# Patient Record
Sex: Male | Born: 1945 | Race: Black or African American | Hispanic: No | Marital: Married
Health system: Southern US, Community
[De-identification: ages and names within clinical notes are randomized; demographics above are authoritative.]

---

## 2012-02-07 ENCOUNTER — Ambulatory Visit: Payer: Self-pay | Admitting: Internal Medicine

## 2012-02-07 ENCOUNTER — Inpatient Hospital Stay: Payer: Self-pay | Admitting: Internal Medicine

## 2012-02-07 LAB — COMPREHENSIVE METABOLIC PANEL
Albumin: 3 g/dL — ABNORMAL LOW (ref 3.4–5.0)
Alkaline Phosphatase: 54 U/L (ref 50–136)
Anion Gap: 9 (ref 7–16)
BUN: 16 mg/dL (ref 7–18)
Bilirubin,Total: 1.7 mg/dL — ABNORMAL HIGH (ref 0.2–1.0)
Calcium, Total: 8.5 mg/dL (ref 8.5–10.1)
Chloride: 91 mmol/L — ABNORMAL LOW (ref 98–107)
Co2: 34 mmol/L — ABNORMAL HIGH (ref 21–32)
Creatinine: 1.28 mg/dL (ref 0.60–1.30)
EGFR (African American): >60
EGFR (Non-African Amer.): 60 — ABNORMAL LOW
Glucose: 77 mg/dL (ref 65–99)
Osmolality: 268 (ref 275–301)
Potassium: 3 mmol/L — ABNORMAL LOW (ref 3.5–5.1)
SGOT(AST): 181 U/L — ABNORMAL HIGH (ref 15–37)
SGPT (ALT): 105 U/L — ABNORMAL HIGH
Sodium: 134 mmol/L — ABNORMAL LOW (ref 136–145)
Total Protein: 6.4 g/dL (ref 6.4–8.2)

## 2012-02-07 LAB — CBC WITH DIFFERENTIAL/PLATELET
Basophil #: 0 10*3/uL (ref 0.0–0.1)
Basophil %: 0.2 %
Eosinophil #: 0 10*3/uL (ref 0.0–0.7)
Eosinophil %: 0.4 %
HCT: 44.4 % (ref 40.0–52.0)
HGB: 14.2 g/dL (ref 13.0–18.0)
Lymphocyte #: 1.5 10*3/uL (ref 1.0–3.6)
Lymphocyte %: 12.7 %
MCH: 28.4 pg (ref 26.0–34.0)
MCHC: 31.9 g/dL — ABNORMAL LOW (ref 32.0–36.0)
MCV: 89 fL (ref 80–100)
Monocyte #: 0.5 10*3/uL (ref 0.0–0.7)
Monocyte %: 4.2 %
Neutrophil #: 9.5 10*3/uL — ABNORMAL HIGH (ref 1.4–6.5)
Neutrophil %: 82.5 %
Platelet: 179 10*3/uL (ref 150–440)
RBC: 4.98 10*6/uL (ref 4.40–5.90)
RDW: 14.8 % — ABNORMAL HIGH (ref 11.5–14.5)
WBC: 11.5 10*3/uL — ABNORMAL HIGH (ref 3.8–10.6)

## 2012-02-07 LAB — PRO B NATRIURETIC PEPTIDE: B-Type Natriuretic Peptide: 6402 pg/mL — ABNORMAL HIGH (ref 0–125)

## 2012-02-07 LAB — CK TOTAL AND CKMB (NOT AT ARMC)
CK, Total: 2152 U/L — ABNORMAL HIGH (ref 35–232)
CK, Total: 1817 U/L — ABNORMAL HIGH (ref 35–232)
CK-MB: 6.5 ng/mL — ABNORMAL HIGH (ref 0.5–3.6)
CK-MB: 6.7 ng/mL — ABNORMAL HIGH (ref 0.5–3.6)

## 2012-02-07 LAB — TROPONIN I
Troponin-I: 0.65 ng/mL — ABNORMAL HIGH
Troponin-I: 0.55 ng/mL — ABNORMAL HIGH

## 2012-02-07 LAB — TSH: Thyroid Stimulating Horm: 1.91 u[IU]/mL

## 2012-02-08 LAB — LIPID PANEL
Cholesterol: 118 mg/dL (ref 0–200)
HDL Cholesterol: 14 mg/dL — ABNORMAL LOW (ref 40–60)
Ldl Cholesterol, Calc: 83 mg/dL (ref 0–100)
Triglycerides: 105 mg/dL (ref 0–200)
VLDL Cholesterol, Calc: 21 mg/dL (ref 5–40)

## 2012-02-08 LAB — TROPONIN I: Troponin-I: 0.6 ng/mL — ABNORMAL HIGH

## 2012-02-08 LAB — CK TOTAL AND CKMB (NOT AT ARMC)
CK, Total: 1213 U/L — ABNORMAL HIGH (ref 35–232)
CK-MB: 5.5 ng/mL — ABNORMAL HIGH (ref 0.5–3.6)

## 2012-02-10 LAB — BASIC METABOLIC PANEL
Anion Gap: 10 (ref 7–16)
BUN: 22 mg/dL — ABNORMAL HIGH (ref 7–18)
Calcium, Total: 8.2 mg/dL — ABNORMAL LOW (ref 8.5–10.1)
Chloride: 96 mmol/L — ABNORMAL LOW (ref 98–107)
Co2: 36 mmol/L — ABNORMAL HIGH (ref 21–32)
Creatinine: 1.44 mg/dL — ABNORMAL HIGH (ref 0.60–1.30)
EGFR (African American): >60
EGFR (Non-African Amer.): 52 — ABNORMAL LOW
Glucose: 77 mg/dL (ref 65–99)
Osmolality: 285 (ref 275–301)
Potassium: 3.4 mmol/L — ABNORMAL LOW (ref 3.5–5.1)
Sodium: 142 mmol/L (ref 136–145)

## 2012-02-10 LAB — CBC WITH DIFFERENTIAL/PLATELET
Basophil #: 0.1 10*3/uL (ref 0.0–0.1)
Basophil %: 0.7 %
Eosinophil #: 0.1 10*3/uL (ref 0.0–0.7)
Eosinophil %: 1.2 %
HCT: 39.7 % — ABNORMAL LOW (ref 40.0–52.0)
HGB: 12.6 g/dL — ABNORMAL LOW (ref 13.0–18.0)
Lymphocyte #: 2.3 10*3/uL (ref 1.0–3.6)
Lymphocyte %: 25.2 %
MCH: 28.5 pg (ref 26.0–34.0)
MCHC: 31.9 g/dL — ABNORMAL LOW (ref 32.0–36.0)
MCV: 89 fL (ref 80–100)
Monocyte #: 0.6 10*3/uL (ref 0.0–0.7)
Monocyte %: 6.1 %
Neutrophil #: 6.1 10*3/uL (ref 1.4–6.5)
Neutrophil %: 66.8 %
Platelet: 209 10*3/uL (ref 150–440)
RBC: 4.44 10*6/uL (ref 4.40–5.90)
RDW: 15.3 % — ABNORMAL HIGH (ref 11.5–14.5)
WBC: 9.2 10*3/uL (ref 3.8–10.6)

## 2012-02-28 ENCOUNTER — Ambulatory Visit: Payer: Self-pay | Admitting: Internal Medicine

## 2012-02-29 ENCOUNTER — Other Ambulatory Visit: Payer: Self-pay | Admitting: Internal Medicine

## 2012-02-29 LAB — BASIC METABOLIC PANEL
Anion Gap: 10 (ref 7–16)
BUN: 16 mg/dL (ref 7–18)
Calcium, Total: 8.4 mg/dL — ABNORMAL LOW (ref 8.5–10.1)
EGFR (Non-African Amer.): 52 — ABNORMAL LOW
Glucose: 102 mg/dL — ABNORMAL HIGH (ref 65–99)
Osmolality: 288 (ref 275–301)

## 2012-02-29 LAB — CBC WITH DIFFERENTIAL/PLATELET
Basophil #: 0.2 10*3/uL — ABNORMAL HIGH (ref 0.0–0.1)
Basophil %: 1.9 %
Eosinophil %: 1.8 %
HCT: 41.8 % (ref 40.0–52.0)
HGB: 13.4 g/dL (ref 13.0–18.0)
Lymphocyte %: 14.6 %
MCHC: 32.1 g/dL (ref 32.0–36.0)
Monocyte #: 0.8 10*3/uL — ABNORMAL HIGH (ref 0.0–0.7)
Monocyte %: 7.7 %
Neutrophil #: 7.4 10*3/uL — ABNORMAL HIGH (ref 1.4–6.5)
Neutrophil %: 74 %
RBC: 4.79 10*6/uL (ref 4.40–5.90)
WBC: 10.1 10*3/uL (ref 3.8–10.6)

## 2012-03-01 ENCOUNTER — Ambulatory Visit: Payer: Self-pay | Admitting: Internal Medicine

## 2012-03-07 ENCOUNTER — Ambulatory Visit: Payer: Self-pay | Admitting: Internal Medicine

## 2012-03-19 ENCOUNTER — Ambulatory Visit: Payer: Self-pay | Admitting: Gastroenterology

## 2012-07-08 ENCOUNTER — Ambulatory Visit: Payer: Self-pay | Admitting: Internal Medicine

## 2012-09-17 DEATH — deceased

## 2014-03-24 IMAGING — CR DG SHOULDER 3+V*L*
1 series · 3 of 3 positions shown · non-contrast
Comparison: none

REASON FOR EXAM: Left Shoulder Pain
COMMENTS:

PROCEDURE:     KDR - KDXR SHOULDER LEFT COMPLETE  - July 08, 2012 [DATE]
RESULT:     Osteoarthritic changes are evident demonstrated by areas of mild
osteophytosis. There is no evidence of acute fracture or dislocation. The
left lung apex is unremarkable.

[Series 1: internal rotate · 0.17mm/px · 3 of 3 slices shown]
[im 1/3]
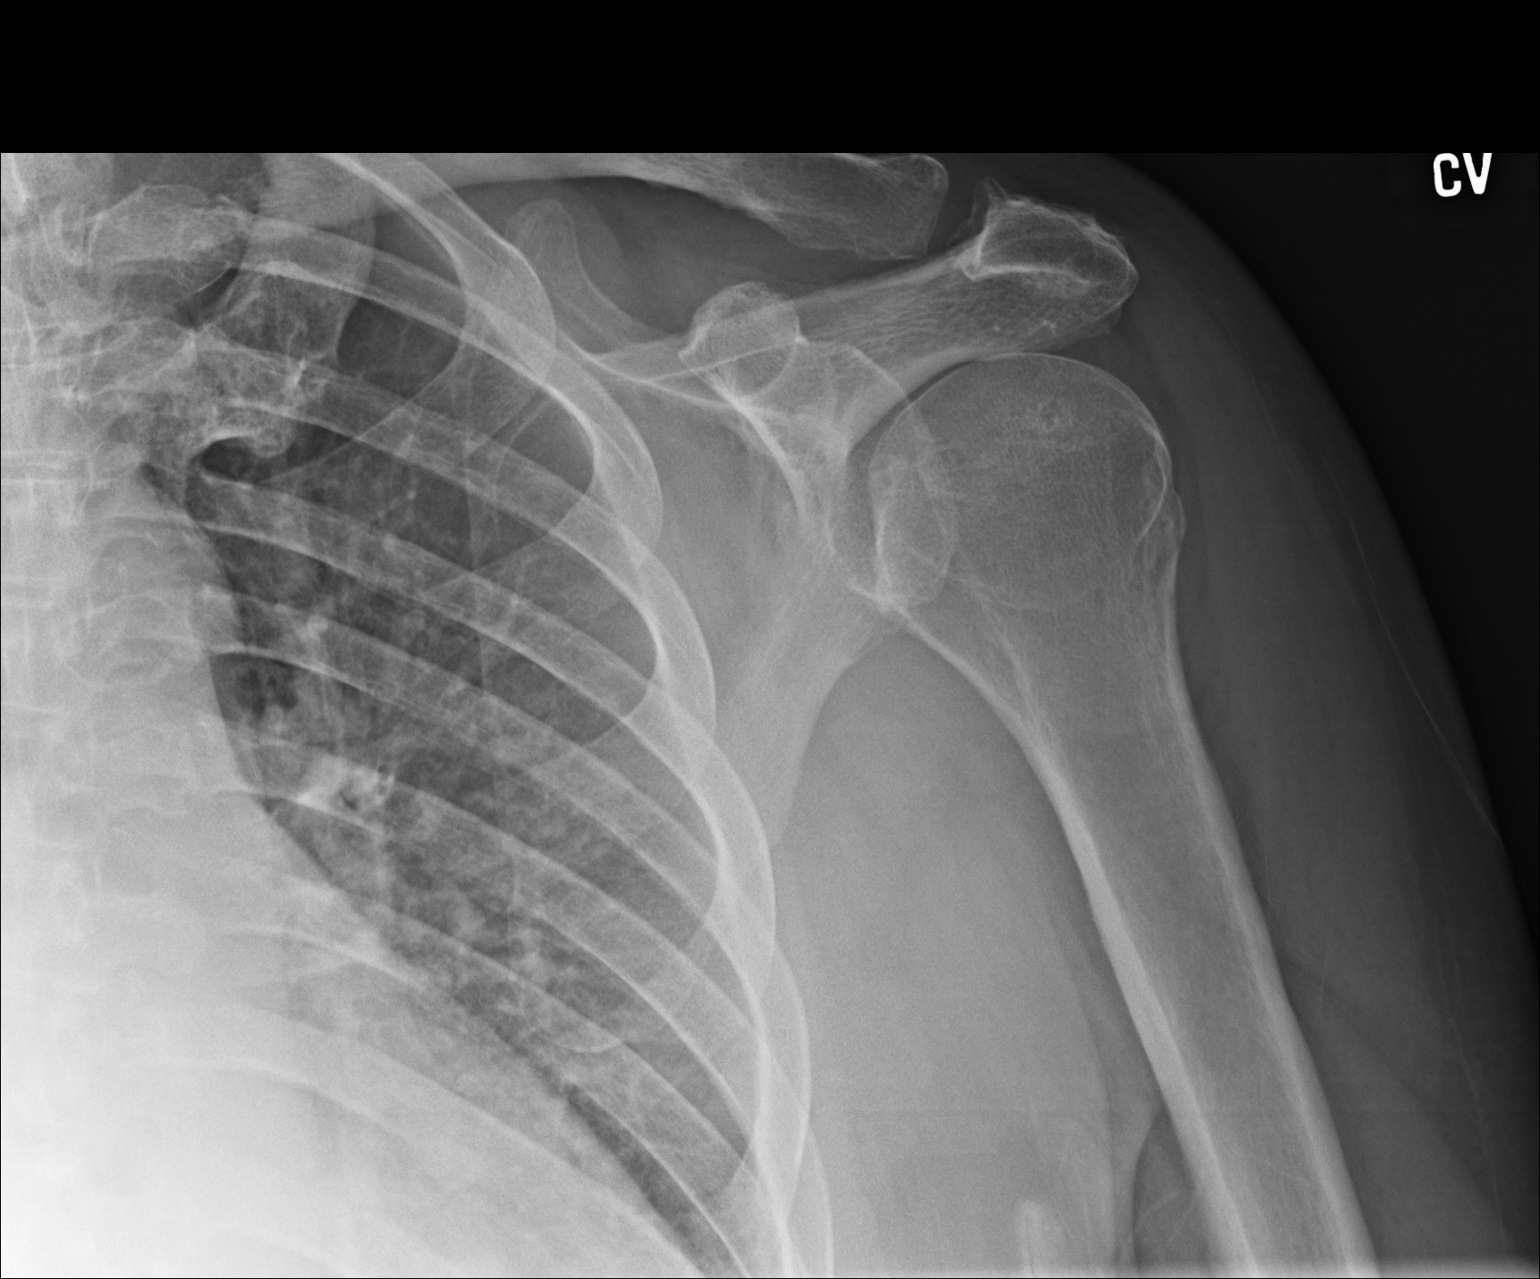
[im 2/3]
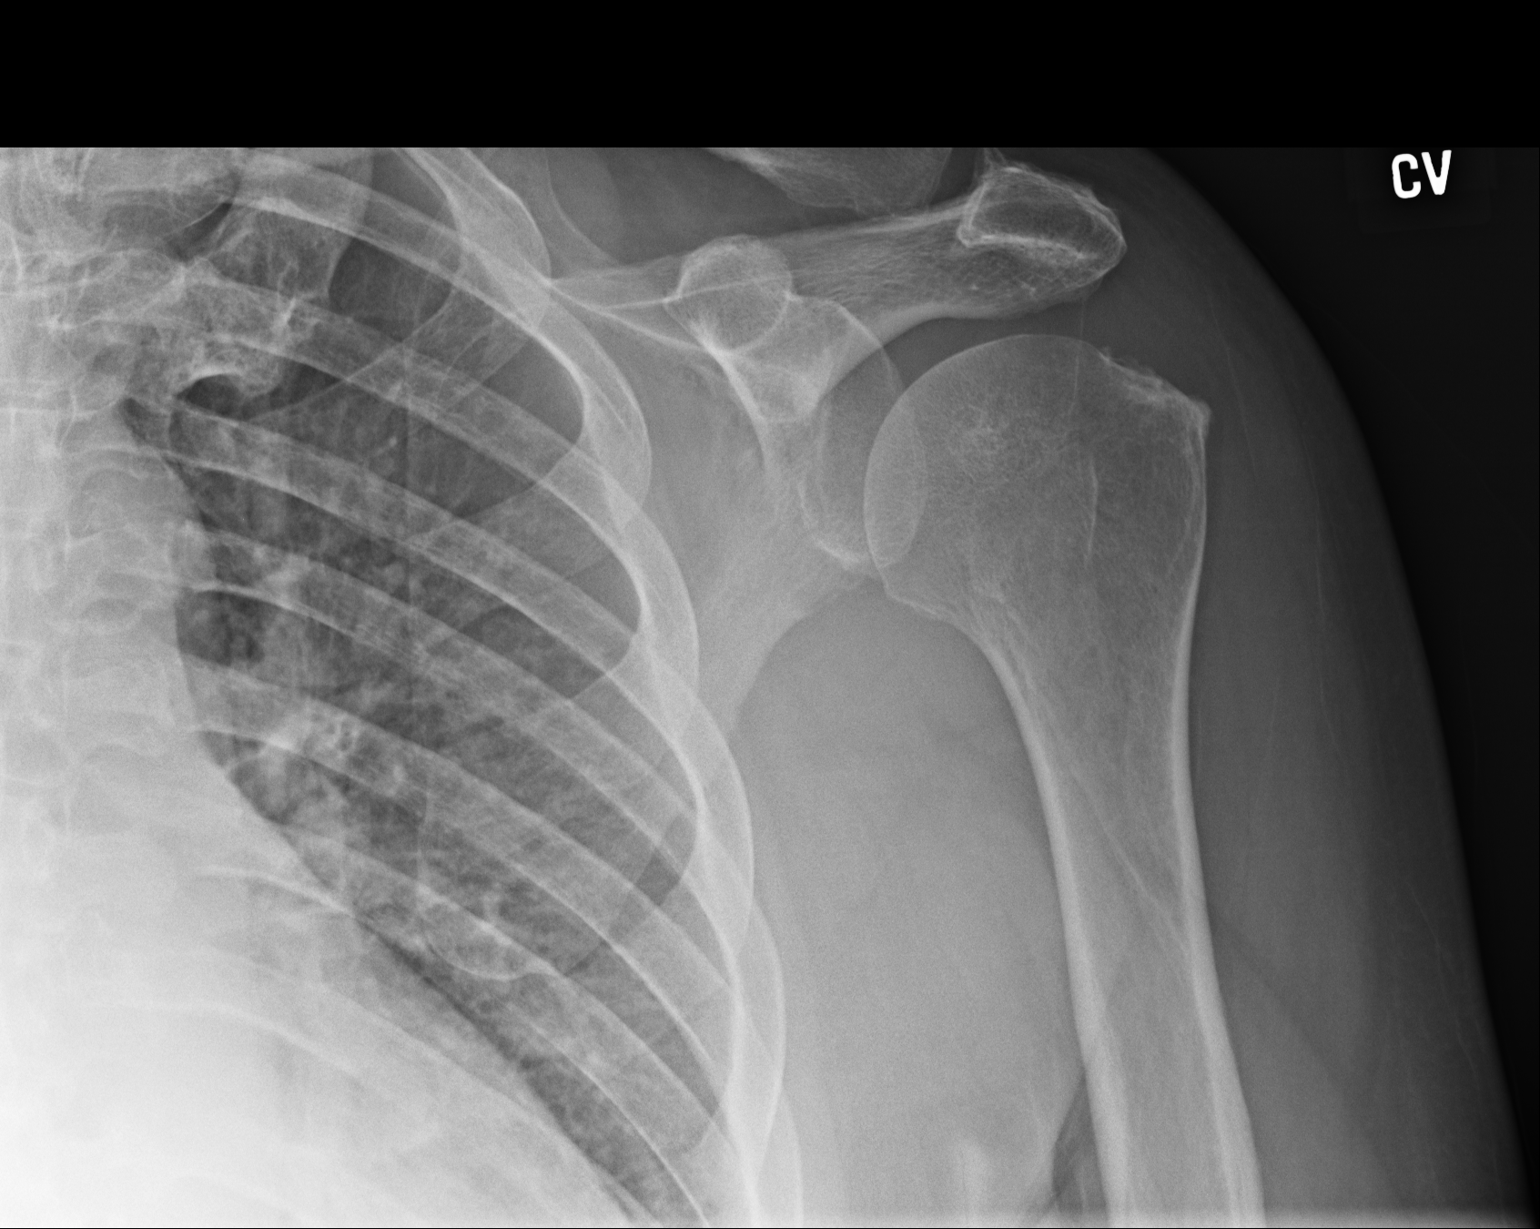
[im 3/3]
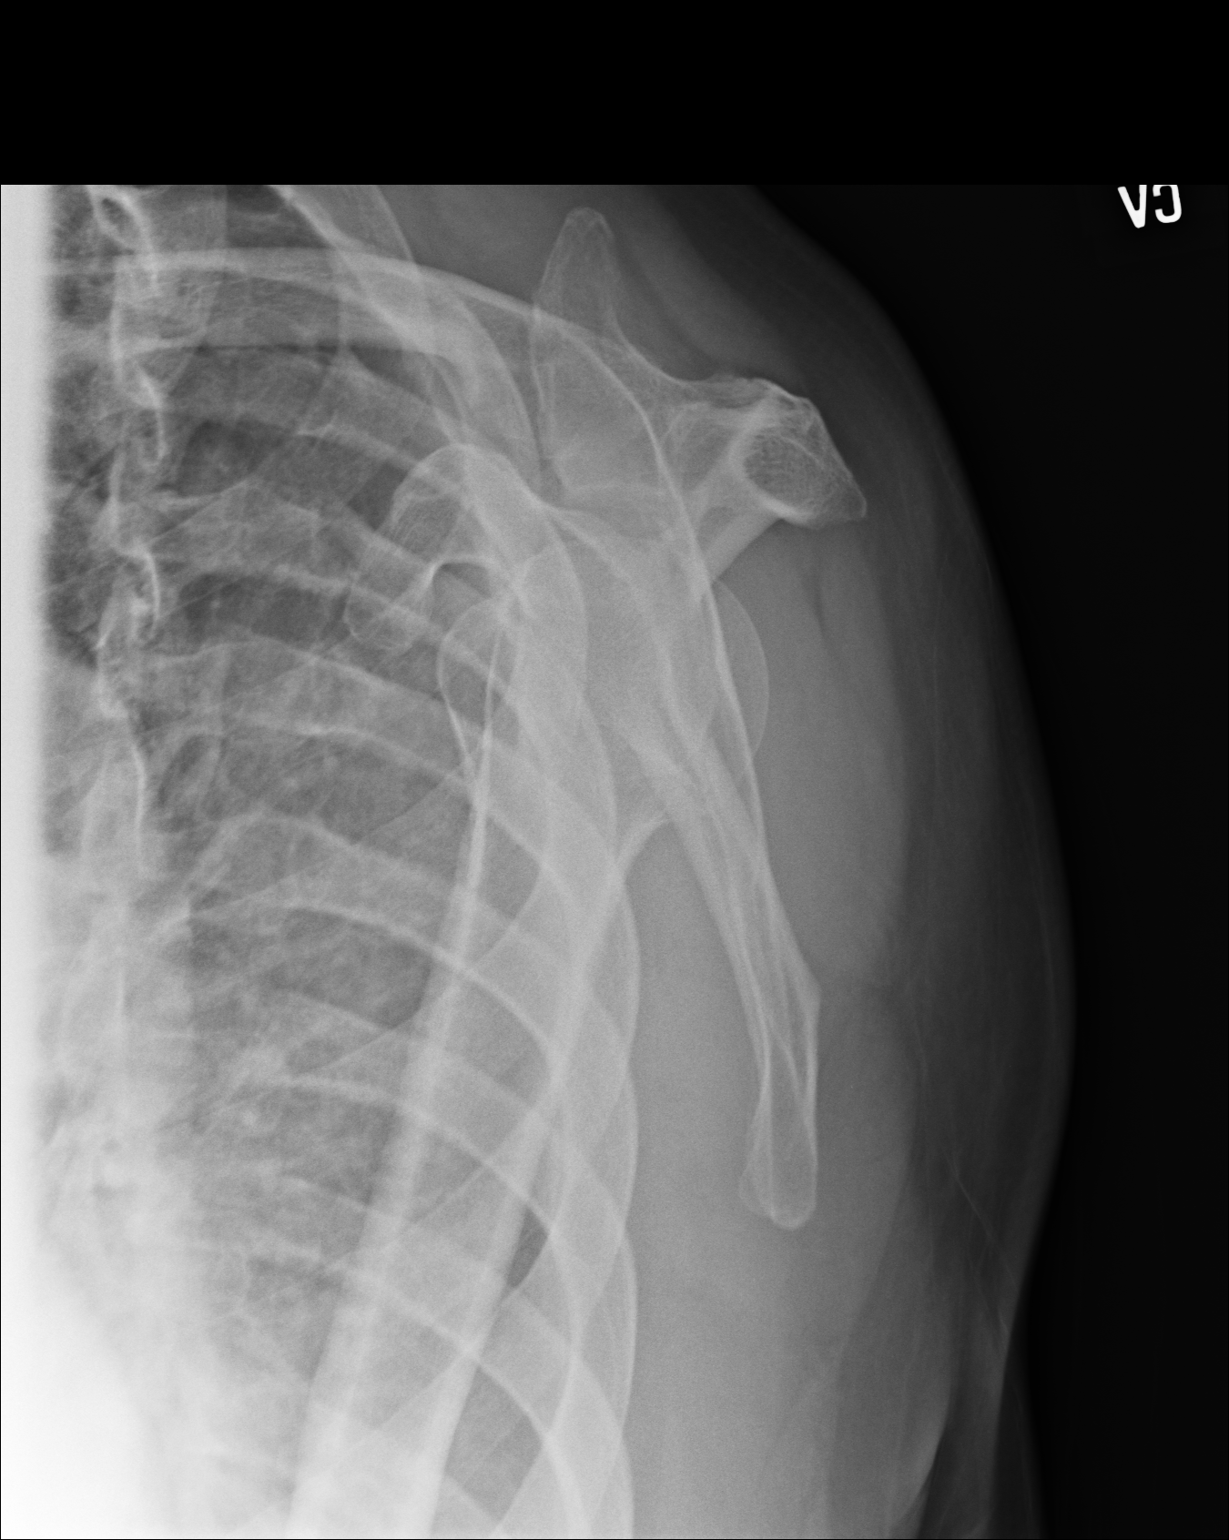

[3 of 3 positions shown; findings below may reference images not displayed]

IMPRESSION: Osteoarthritic changes without evidence of acute osseous abnormality.  If
there is persistent clinical concern, further evaluation with MRI is
recommended.

## 2015-04-11 NOTE — Discharge Summary (Signed)
PATIENT NAME:  Jesus Mathis, Jesus Mathis MR#:  [id number] DATE OF BIRTH:  Apr 29, 1946  DATE OF ADMISSION:  02/07/2012 DATE OF DISCHARGE:  02/10/2012  HISTORY OF PRESENT ILLNESS: Jesus DellWalter Mathis is a 69 year old gentleman who was admitted into the hospital with weakness, tiredness, and shortness of breath for the last 2 to 3 days. The patient denies any history of chest pain or history of upper respiratory infection. There is no previous history of stroke or heart attack, but he complained of some swelling of the legs. The patient is known to have diabetes and is being [facility]. The patient also has a benign tumor of the bladder. He also has a previous history of congestive heart failure. His BNP on admission was 6402. SGPT was 105 and SGOT 181. CPK was 2152 with MB of 65. The patient does not smoke. He is known to have diabetes and posttraumatic stress disorder. He also says that he has been exposed to [employer]. For the rest of the details of the history and physical, please see typed history and physical sheet.   ADMITTING DIAGNOSES:  1. Congestive heart failure. 2. Acute non-ST elevated myocardial infarction for more than 24 hours, old. 3. Sleep apnea. 4. Diabetes. 5. Exogenous obesity. 6. History of exposure to [employer]. 7. Posttraumatic stress disorder. 8. History of pneumonia in the past. 9. History of removal of bladder tumor by Dr. Evelene CroonWolff.   HOSPITAL COURSE: After admission the patient was treated as if he had a myocardial infarction. He was found to be morbidly obese so at this time he was stressed the importance of losing weight. He was continued on amlodipine 2.5 mg p.o. daily, gabapentin 400 mg p.o. daily, hydrochlorothiazide 10 mg once a day, and simvastatin 20 mg tablet p.o. daily. The patient was not considered for cardiac catheterization unless he has more chest pain.   ADDITIONAL DISCHARGE MEDICATIONS:  1. Lisinopril 10 mg p.o. daily. 2. Lipitor 40 mg p.o.  daily.  3. Aspirin 81 mg p.o. daily.  4. Plavix 75 mg p.o. daily.  5. Coreg 12.5 mg p.o. twice a day.   DISCHARGE FOLLOWUP: He is to followup in my office in about two weeks from discharge. At that time, we will schedule him for a functional study. ____________________________ Corky DownsJaved Akshara Blumenthal, MD jm:slb D: 02/19/2012 22:24:00 ET T: 02/20/2012 14:01:37 ET JOB#: [id number]  cc: Corky DownsJaved Ekansh Sherk, MD, <Dictator> Corky DownsJAVED Shaterrica Territo MD ELECTRONICALLY SIGNED 02/25/2012 12:17

## 2015-04-11 NOTE — H&P (Signed)
PATIENT NAME:  Alcide GoodnessSHOFFNER, Kerrion G MR#:  [id number] DATE OF BIRTH:  05-13-1946  DATE OF ADMISSION:  02/07/2012  HISTORY OF PRESENT ILLNESS:  Franz DellWalter Vernet is a 69 year old gentleman who was admitted into the hospital with weakness, tiredness, shortness of breath for the last 2 to 3 days. He denies any history of chest pain, denies any history of upper respiratory infection. He complains of some tight feeling on the right side of the head, especially when he wakes up in the morning. He denies any history of chest pain. There is no previous history of a heart attack or a stroke. He complained of some swelling of the legs. The patient is known to have mild diabetes, being [facility]. He has never been admitted in this hospital except for outpatient surgery by Dr. Evelene CroonWolff for a benign bladder tumor. Because of his shortness of breath and decreased exercise tolerance, a chest x-ray was obtained which revealed congestive heart failure, so he was admitted into the hospital to rule out myocardial injury and to evaluate the left ventricular function. His BNP on admission was 6402. Sodium was 134, potassium 3.0, GFR less than 60. Albumin 3.0, bilirubin 1.7, and SGOT was 181, SGPT 105. CPK total was 2152, with a CK-MB of 65, and troponin of 0.65. TSH is 1.91. The patient says that he has been treated for pneumonia.   SOCIAL HISTORY: He does not smoke, does admit to smoking in the past. He denies any history of drinking alcohol recently or in the past. 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 Also, he was worked at [employer] years.   PAST MEDICAL HISTORY:  1. Obesity.  2. Diabetes for which he takes metformin. Blood sugar is under control at the present time. For the diabetic care, [facility].  3. History of posttraumatic stress disorder for which he is being [facility].  4. The patient also says that he has also been exposed to  Agent Orange.  5. He says he has sleep apnea but does not use his machine.  PHYSICAL EXAMINATION:  GENERAL: The patient is an obese male in no acute distress.   VITAL SIGNS: Temperature 97.8, pulse rate 95, respiration 20, systolic blood pressure 134, diastolic 74, pulse rate is 92.   HEENT: Head normocephalic. Pupils are reactive. Sclerae are anicteric. Tongue is moist, papillated.   NECK: Supple. There is no bruit, no goiter. No lymphadenopathy. Trachea is central.   HEART: Apical impulse is not palpable. Both heart sounds are distant. No murmur is audible.   CHEST: Bilateral crackles of both bases.   ABDOMEN: Obese, soft, nontender. Liver and spleen are not enlarged. Bowel sounds are audible.   EXTREMITIES: 2+ pedal edema. Peripheral pulses are 2+.   RECTAL: Examination not done because of shortness of breath.   DIAGNOSTIC AND RADIOLOGICAL DATA: EKG shows sinus tachycardia, nonspecific ST-T abnormalities.   IMPRESSION:  1. Congestive heart failure, acute.  2. Acute non-ST elevated myocardial infarction, more than 24 hours old. 3. Sleep apnea. 4. Diabetes.  5. Exogenous obesity.  6. History of exposure to [employer].  7. Posttraumatic stress disorder.  8. History of pneumonia in the past. 9. History of removal of bladder tumor by Dr. Evelene CroonWolff.   PLAN OF TREATMENT: The patient will be admitted into the hospital. We will start him on ACE, also beta blocker in a smaller dose. We will  start him on Lovenox. Repeat serial CPK isoenzymes, and EKG, and evaluate left ventricular function with echocardiogram. The patient was advised about control of his diabetes. His blood sugar is okay today. He was also advised to lose weight.  ____________________________ Corky Downs, MD jm:cbb D: 02/07/2012 17:39:31 ET T: 02/07/2012 17:56:50 ET JOB#: [id number] Graison Leinberger MD ELECTRONICALLY SIGNED 02/18/2012 22:01
